# Patient Record
Sex: Male | Born: 1960 | Race: White | Hispanic: No | Marital: Married | Smoking: Former smoker
Health system: Southern US, Community
[De-identification: ages and names within clinical notes are randomized; demographics above are authoritative.]

## PROBLEM LIST (undated history)

## (undated) DIAGNOSIS — I10 Essential (primary) hypertension: Secondary | ICD-10-CM

## (undated) DIAGNOSIS — E7800 Pure hypercholesterolemia, unspecified: Secondary | ICD-10-CM

## (undated) DIAGNOSIS — M1990 Unspecified osteoarthritis, unspecified site: Secondary | ICD-10-CM

## (undated) DIAGNOSIS — G8929 Other chronic pain: Secondary | ICD-10-CM

## (undated) DIAGNOSIS — E119 Type 2 diabetes mellitus without complications: Secondary | ICD-10-CM

---

## 2016-01-12 ENCOUNTER — Encounter (HOSPITAL_COMMUNITY): Payer: Self-pay

## 2016-01-12 ENCOUNTER — Emergency Department (HOSPITAL_COMMUNITY): Payer: BLUE CROSS/BLUE SHIELD

## 2016-01-12 ENCOUNTER — Emergency Department (HOSPITAL_COMMUNITY)
Admission: EM | Admit: 2016-01-12 | Discharge: 2016-01-12 | Disposition: A | Discharge status: Home / Self Care | Payer: BLUE CROSS/BLUE SHIELD | Attending: Emergency Medicine | Admitting: Emergency Medicine

## 2016-01-12 DIAGNOSIS — I10 Essential (primary) hypertension: Secondary | ICD-10-CM | POA: Diagnosis not present

## 2016-01-12 DIAGNOSIS — N201 Calculus of ureter: Secondary | ICD-10-CM | POA: Diagnosis not present

## 2016-01-12 DIAGNOSIS — E119 Type 2 diabetes mellitus without complications: Secondary | ICD-10-CM | POA: Diagnosis not present

## 2016-01-12 DIAGNOSIS — R109 Unspecified abdominal pain: Secondary | ICD-10-CM | POA: Diagnosis present

## 2016-01-12 DIAGNOSIS — Z7982 Long term (current) use of aspirin: Secondary | ICD-10-CM | POA: Insufficient documentation

## 2016-01-12 DIAGNOSIS — Z7984 Long term (current) use of oral hypoglycemic drugs: Secondary | ICD-10-CM | POA: Insufficient documentation

## 2016-01-12 DIAGNOSIS — Z79899 Other long term (current) drug therapy: Secondary | ICD-10-CM | POA: Insufficient documentation

## 2016-01-12 DIAGNOSIS — Z87891 Personal history of nicotine dependence: Secondary | ICD-10-CM | POA: Diagnosis not present

## 2016-01-12 HISTORY — DX: Other chronic pain: G89.29

## 2016-01-12 HISTORY — DX: Type 2 diabetes mellitus without complications: E11.9

## 2016-01-12 HISTORY — DX: Pure hypercholesterolemia, unspecified: E78.00

## 2016-01-12 HISTORY — DX: Essential (primary) hypertension: I10

## 2016-01-12 HISTORY — DX: Unspecified osteoarthritis, unspecified site: M19.90

## 2016-01-12 LAB — BASIC METABOLIC PANEL
Anion gap: 6 (ref 5–15)
BUN: 22 mg/dL — ABNORMAL HIGH (ref 6–20)
CO2: 27 mmol/L (ref 22–32)
Calcium: 9.4 mg/dL (ref 8.9–10.3)
Chloride: 107 mmol/L (ref 101–111)
Creatinine, Ser: 0.97 mg/dL (ref 0.61–1.24)
GFR calc Af Amer: >60 mL/min (ref >60)
GFR calc non Af Amer: >60 mL/min (ref >60)
Glucose, Bld: 122 mg/dL — ABNORMAL HIGH (ref 65–99)
Potassium: 4.1 mmol/L (ref 3.5–5.1)
Sodium: 140 mmol/L (ref 135–145)

## 2016-01-12 LAB — URINALYSIS, ROUTINE W REFLEX MICROSCOPIC
BILIRUBIN URINE: NEGATIVE
Ketones, ur: NEGATIVE mg/dL
Leukocytes, UA: NEGATIVE
Nitrite: NEGATIVE
PH: 5 (ref 5.0–8.0)
Protein, ur: NEGATIVE mg/dL
SPECIFIC GRAVITY, URINE: 1.035 — AB (ref 1.005–1.030)

## 2016-01-12 LAB — URINE MICROSCOPIC-ADD ON
RBC / HPF: NONE SEEN RBC/hpf (ref 0–5)
WBC, UA: NONE SEEN WBC/hpf (ref 0–5)

## 2016-01-12 LAB — CBC WITH DIFFERENTIAL/PLATELET
Basophils Absolute: 0 10*3/uL (ref 0.0–0.1)
Basophils Relative: 0 %
Eosinophils Absolute: 0 10*3/uL (ref 0.0–0.7)
Eosinophils Relative: 0 %
HCT: 43.7 % (ref 39.0–52.0)
Hemoglobin: 14.9 g/dL (ref 13.0–17.0)
Lymphocytes Relative: 21 %
Lymphs Abs: 2 10*3/uL (ref 0.7–4.0)
MCH: 30.2 pg (ref 26.0–34.0)
MCHC: 34.1 g/dL (ref 30.0–36.0)
MCV: 88.6 fL (ref 78.0–100.0)
Monocytes Absolute: 0.5 10*3/uL (ref 0.1–1.0)
Monocytes Relative: 6 %
Neutro Abs: 6.8 10*3/uL (ref 1.7–7.7)
Neutrophils Relative %: 73 %
Platelets: 238 10*3/uL (ref 150–400)
RBC: 4.93 MIL/uL (ref 4.22–5.81)
RDW: 13 % (ref 11.5–15.5)
WBC: 9.3 10*3/uL (ref 4.0–10.5)

## 2016-01-12 MED ORDER — TAMSULOSIN HCL 0.4 MG PO CAPS: 0.4000 mg | ORAL_CAPSULE | Freq: Every day | ORAL | 0 refills | Status: DC

## 2016-01-12 MED ORDER — OXYCODONE-ACETAMINOPHEN 5-325 MG PO TABS: 2.0000 | ORAL_TABLET | ORAL | 0 refills | Status: DC | PRN

## 2016-01-12 MED ORDER — SODIUM CHLORIDE 0.9 % IV BOLUS (SEPSIS)
1000.0000 mL | Freq: Once | INTRAVENOUS | Status: AC
  Administered 2016-01-12: 1000 mL via INTRAVENOUS

## 2016-01-12 NOTE — ED Provider Notes (Signed)
[facility]-EMERGENCY DEPT Provider Note   CSN: [id number] Arrival date & time: 01/12/16  1148  First Provider Contact:  First MD Initiated Contact with Patient 01/12/16 1639        History   Chief Complaint Chief Complaint  Patient presents with  . Back Pain  . Emesis  . Urinary Retention    HPI Patient presents to the emergency department with right-sided flank pain that started early this morning.  The patient states he had excruciating flank pain that started suddenly.  The patient states he never had pain like this in the past.  He states he did have some decreased urination after that time.  He states he did have nausea but no vomiting.  Patient states that nothing seems make the condition better or worse. The patient denies chest pain, shortness of breath, headache,blurred vision, neck pain, fever, cough, weakness, numbness, dizziness, anorexia, edema,  vomiting, diarrhea, rash, back pain, dysuria, hematemesis, bloody stool, near syncope, or syncope. Past Medical History:  Diagnosis Date  . Arthritis   . Chronic pain   . Diabetes mellitus without complication (HCC)   . High cholesterol   . Hypertension     There are no active problems to display for this patient.   History reviewed. No pertinent surgical history.     Home Medications    Prior to Admission medications   Medication Sig Start Date End Date Taking? Authorizing Provider  aspirin EC 81 MG tablet Take 81 mg by mouth daily.   Yes Historical Provider, MD  atorvastatin (LIPITOR) 40 MG tablet Take 40 mg by mouth at bedtime.   Yes Historical Provider, MD  cholecalciferol (VITAMIN D) 1000 units tablet Take 1,000 Units by mouth daily.   Yes Historical Provider, MD  cyclobenzaprine (FLEXERIL) 10 MG tablet Take 10 mg by mouth 3 (three) times daily as needed for muscle spasms.   Yes Historical Provider, MD  diclofenac (VOLTAREN) 75 MG EC tablet Take 75 mg by mouth at bedtime.   Yes Historical Provider, MD  lisinopril  (PRINIVIL,ZESTRIL) 20 MG tablet Take 20 mg by mouth at bedtime.   Yes Historical Provider, MD  metFORMIN (GLUCOPHAGE-XR) 500 MG 24 hr tablet Take 500 mg by mouth 2 (two) times daily with a meal.   Yes Historical Provider, MD  traMADol (ULTRAM) 50 MG tablet Take 50 mg by mouth every 6 (six) hours as needed for moderate pain.   Yes Historical Provider, MD  vitamin B-12 (CYANOCOBALAMIN) 1000 MCG tablet Take 1,000 mcg by mouth daily.   Yes Historical Provider, MD    Family History History reviewed. No pertinent family history.  Social History Social History  Substance Use Topics  . Smoking status: Former Games developer  . Smokeless tobacco: Never Used  . Alcohol use No     Allergies   Review of patient's allergies indicates no known allergies.   Review of Systems Review of Systems All other systems negative except as documented in the HPI. All pertinent positives and negatives as reviewed in the HPI.  Physical Exam Updated Vital Signs BP 148/76 (BP Location: Right Arm)   Pulse (!) 53   Temp 98.3 F (36.8 C) (Oral)   Resp 17   SpO2 100%   Physical Exam  Constitutional: He is oriented to person, place, and time. He appears well-developed and well-nourished. No distress.  HENT:  Head: Normocephalic and atraumatic.  Mouth/Throat: Oropharynx is clear and moist.  Eyes: Pupils are equal, round, and reactive to light.  Neck: Normal range  of motion. Neck supple.  Cardiovascular: Normal rate, regular rhythm and normal heart sounds.  Exam reveals no gallop and no friction rub.   No murmur heard. Pulmonary/Chest: Effort normal and breath sounds normal. No respiratory distress. He has no wheezes.  Abdominal: Soft. Bowel sounds are normal. He exhibits no distension. There is no tenderness. There is no guarding.  Neurological: He is alert and oriented to person, place, and time. He exhibits normal muscle tone. Coordination normal.  Skin: Skin is warm and dry. No rash noted. No erythema.    Psychiatric: He has a normal mood and affect. His behavior is normal.  Nursing note and vitals reviewed.    ED Treatments / Results  Labs (all labs ordered are listed, but only abnormal results are displayed) Labs Reviewed  URINALYSIS, ROUTINE W REFLEX MICROSCOPIC (NOT AT [facility]) - Abnormal; Notable for the following:       Result Value   Color, Urine AMBER (*)    APPearance TURBID (*)    Specific Gravity, Urine 1.035 (*)    Glucose, UA >1000 (*)    Hgb urine dipstick MODERATE (*)    All other components within normal limits  URINE MICROSCOPIC-ADD ON - Abnormal; Notable for the following:    Squamous Epithelial / LPF 0-5 (*)    Bacteria, UA MANY (*)    All other components within normal limits  BASIC METABOLIC PANEL - Abnormal; Notable for the following:    Glucose, Bld 122 (*)    BUN 22 (*)    All other components within normal limits  URINE CULTURE  CBC WITH DIFFERENTIAL/PLATELET    EKG  EKG Interpretation None       Radiology Ct Renal Stone Study  Result Date: 01/12/2016 CLINICAL DATA:  Acute right-sided flank pain which began yesterday. Nausea and vomiting. EXAM: CT ABDOMEN AND PELVIS WITHOUT CONTRAST TECHNIQUE: Multidetector CT imaging of the abdomen and pelvis was performed following the standard protocol without IV contrast. COMPARISON:  None. FINDINGS: The lack of intravenous contrast limits the ability to evaluate solid abdominal organs. Lower chest: Limited visualization of lower thorax demonstrates minimal dependent subpleural ground-glass atelectasis, right greater than left. No discrete focal airspace opacities. No pleural effusion or pneumothorax. Normal heart size.  No pericardial effusion. Hepatobiliary: Normal hepatic contour. There is mild diffuse decreased attenuation of the hepatic parenchyma suggestive of hepatic steatosis. Normal noncontrast appearance of the gallbladder given degree distention. No radiopaque gallstones. No ascites. Pancreas: Normal  noncontrast appearance of the pancreas Spleen: Normal noncontrast appearance of the spleen Adrenals/Urinary Tract: There is a punctate (approximately 3 mm) stone within the superior aspect of the right ureter, at the level of the right UPJ, which results in mild upstream right-sided pelvicaliectasis and asymmetric right-sided perinephric stranding (coronal image 110, series 7). No additional renal stones are seen. No renal stones are seen along the expected course of the left ureter or within the urinary bladder. Normal noncontrast appearance the urinary bladder given degree distention. Note is made of an approximately 1.1 x 1.6 cm hypo attenuating left-sided renal lesion which is incompletely characterized on this noncontrast examination though favored to represent a renal cyst. Normal noncontrast appearance the bilateral adrenal glands Stomach/Bowel: Minimal colonic diverticulosis without evidence of diverticulitis. The bowel is otherwise normal in course and caliber without wall thickening or evidence of enteric obstruction. Normal noncontrast appearance of the terminal ileum and retrocecal appendix. No pneumoperitoneum, pneumatosis or portal venous gas. Vascular/Lymphatic: Scattered very minimal amount of calcified atherosclerotic plaque within a  normal caliber abdominal aorta. There is a minimal amount of ill-defined stranding within the abdominal mesenteric a with associated scattered mesenteric lymph nodes which are numerous though individually not enlarged by size criteria with index mesenteric lymph node measuring 0.5 cm in greatest short axis diameter (image 46, series 2), presumably reactive etiology. No bulky retroperitoneal, mesenteric, pelvic or inguinal lymphadenopathy on this noncontrast examination. Reproductive: Normal noncontrast appearance of the pelvic organs. Several phleboliths are noted within the left hemipelvis. No free fluid in the pelvic cul-de-sac. Other: Small bilateral mesenteric fat  containing inguinal hernias. Musculoskeletal: No acute or aggressive osseous abnormalities IMPRESSION: 1. Punctate (approximately 3 mm) stone at the level of the right UPJ results in mild upstream right-sided pelvicaliectasis and asymmetric right-sided perinephric stranding. 2. No other evidence of nephrolithiasis. No evidence of left-sided urinary obstruction. 3. Suspected hepatic steatosis. Correlation with LFTs is recommended. 4. Suspected left-sided renal cyst, incompletely characterized on this noncontrast examination. 5. Colonic diverticulosis without evidence of diverticulitis. 6.  Aortic Atherosclerosis (ICD10-170.0) Electronically Signed   By: Simonne Come M.D.   On: 01/12/2016 18:47   Procedures Procedures (including critical care time)  Medications Ordered in ED Medications  sodium chloride 0.9 % bolus 1,000 mL (0 mLs Intravenous Stopped 01/12/16 1830)     Initial Impression / Assessment and Plan / ED Course  I have reviewed the triage vital signs and the nursing notes.  Pertinent labs & imaging results that were available during my care of the patient were reviewed by me and considered in my medical decision making (see chart for details).  Clinical Course   Patient be treated for a renal stone.  Advised follow-up with urology.  The patient is feeling completely resolution of his symptoms.  Patient advised plan and all questions were answered   Final Clinical Impressions(s) / ED Diagnoses   Final diagnoses:  Flank pain    New Prescriptions New Prescriptions   No medications on file     Charlestine Night, PA-C 01/16/16 1334    Pricilla Loveless, MD 01/16/16 1541

## 2016-01-12 NOTE — ED Notes (Signed)
Pt reports understanding of discharge information. No questions at time of discharge 

## 2016-01-12 NOTE — ED Triage Notes (Signed)
Pt c/o R lower back pain, emesis, and difficulty urinating starting this morning.  Currently, denies pain.  Pt was seen at Urgent Care earlier and given Toradol IM.  UA resulted a large amount of blood.  Hx of chronic low back pain.

## 2016-01-12 NOTE — Discharge Instructions (Signed)
Return here as needed.  Follow-up with the urologist provided. 

## 2016-01-12 NOTE — ED Notes (Addendum)
Patient comes from home with c/o right flank pain which began acutely yesterday. Patient had some nausea with minimal vomiting earlier today.  He went to PCP office and had a urinalysis which indicated hematuria. Patient's abdomen soft, non-tender to palpation.  Patient has no CVA tenderness.  Patient received Toradol IM at PCP office and states he is currently pain free with the exception of a "twinge" of pain in right flank.

## 2016-01-14 LAB — URINE CULTURE: Culture: NO GROWTH

## 2018-01-03 IMAGING — CT CT RENAL STONE PROTOCOL
2 of 3 series · 15 of 46 positions shown, 17 images · non-contrast
Comparison: None.

CLINICAL DATA: Acute right-sided flank pain which began yesterday.
Nausea and vomiting.

EXAM:
CT ABDOMEN AND PELVIS WITHOUT CONTRAST
TECHNIQUE: Multidetector CT imaging of the abdomen and pelvis was performed
following the standard protocol without IV contrast.

[Series 5: lung · axial · 0.86mm/px · z∈[-167,-33]mm · 12 of 77 slices shown, 14 images]
[im 5/77  soft-tissue]
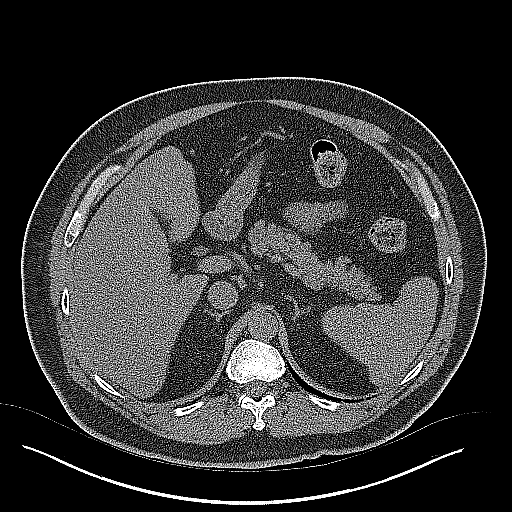
[im 5/77  bone]
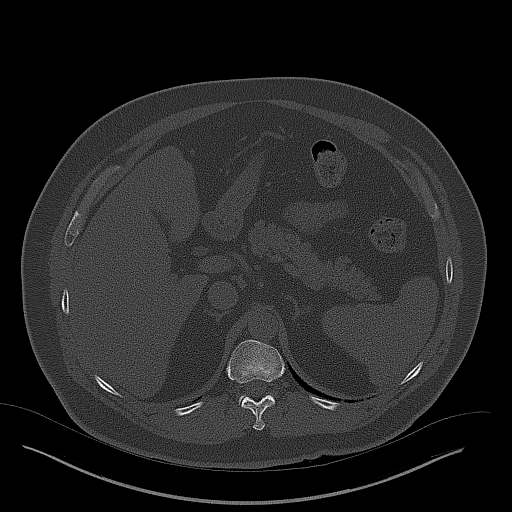
[im 10/77  soft-tissue]
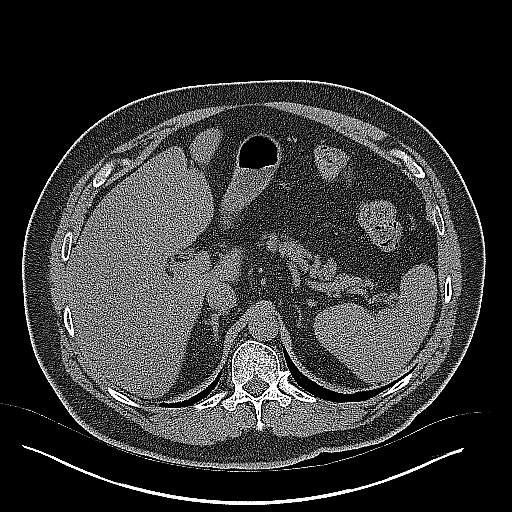
[im 18/77  soft-tissue]
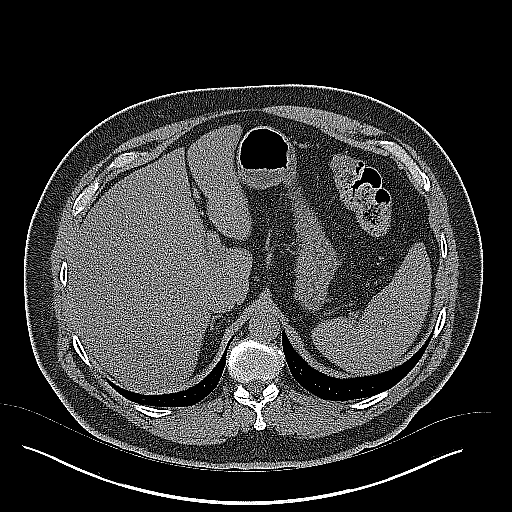
[im 23/77  soft-tissue]
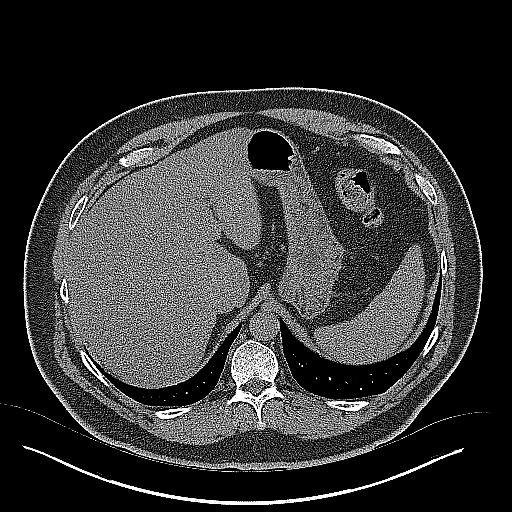
[im 30/77  soft-tissue]
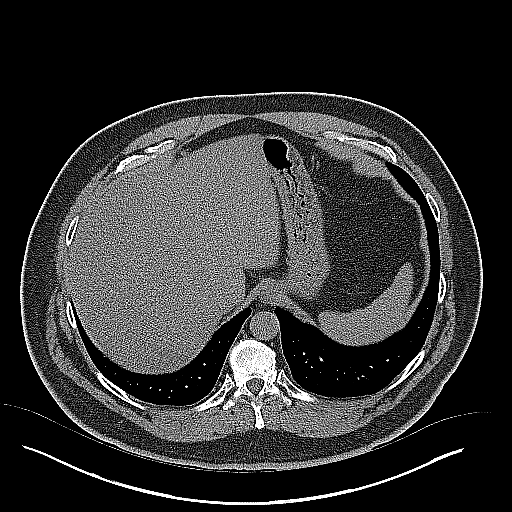
[im 35/77  soft-tissue]
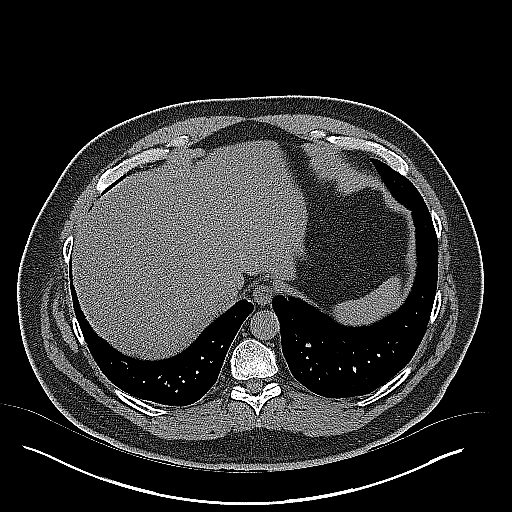
[im 42/77  soft-tissue]
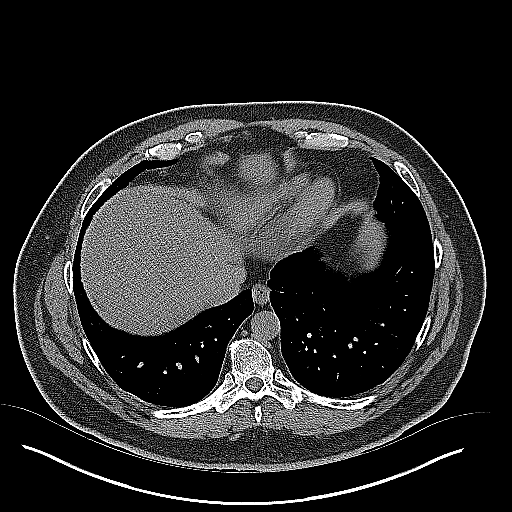
[im 47/77  soft-tissue]
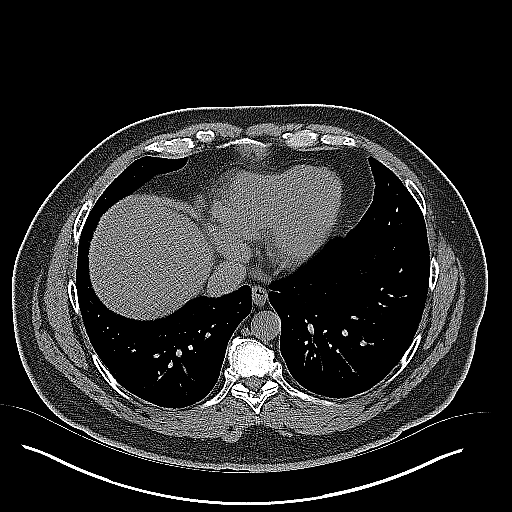
[im 54/77  soft-tissue]
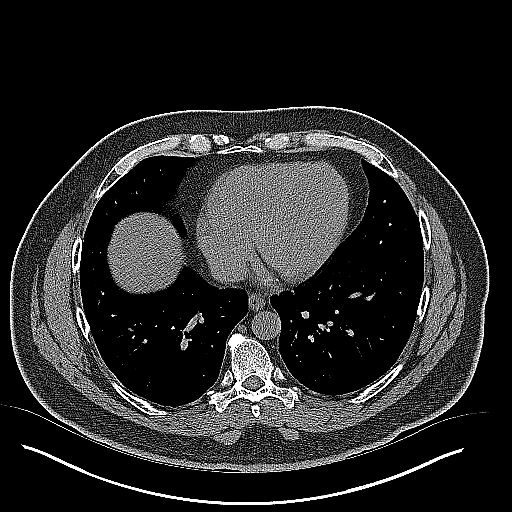
[im 54/77  bone]
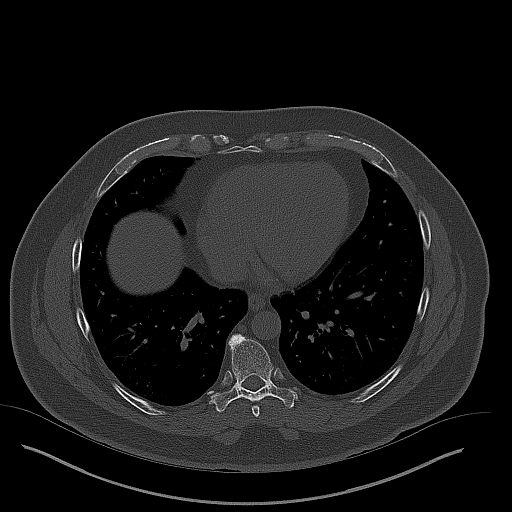
[im 59/77  soft-tissue]
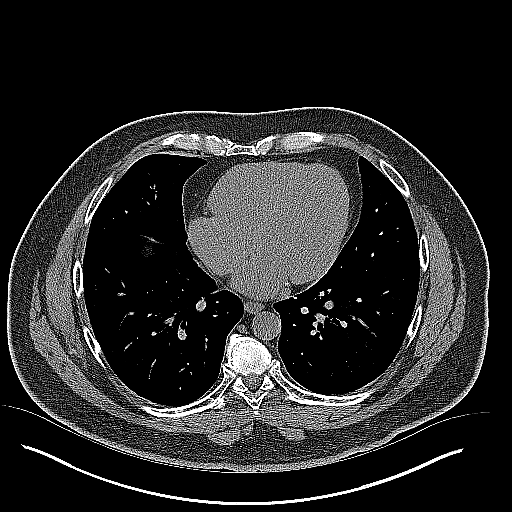
[im 67/77  soft-tissue]
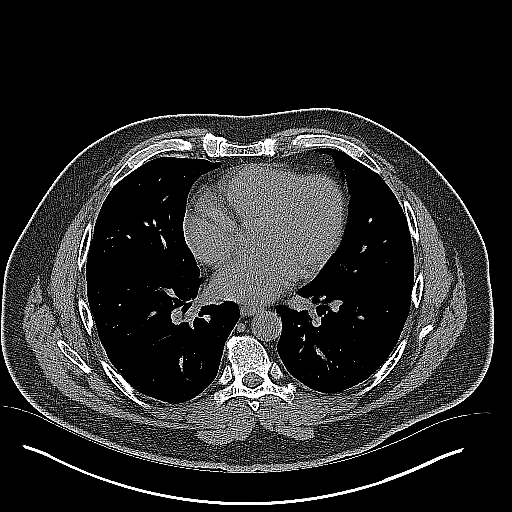
[im 72/77  soft-tissue]
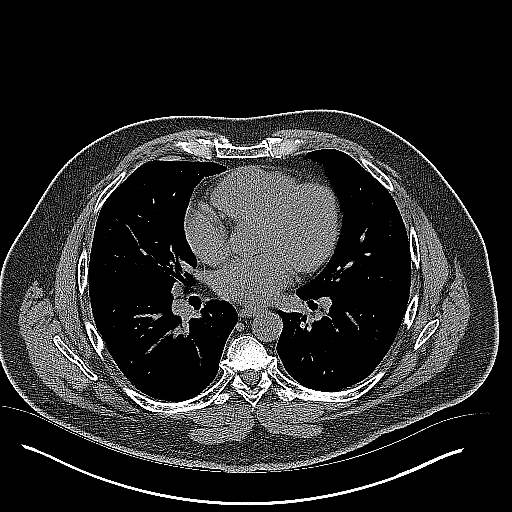

[Series 7: coronal · coronal · 0.90mm/px · 3 of 191 slices shown]
[im 64/191  soft-tissue]
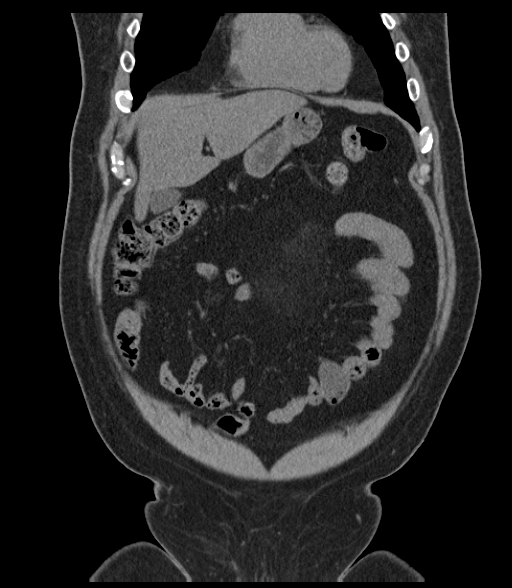
[im 85/191  soft-tissue]
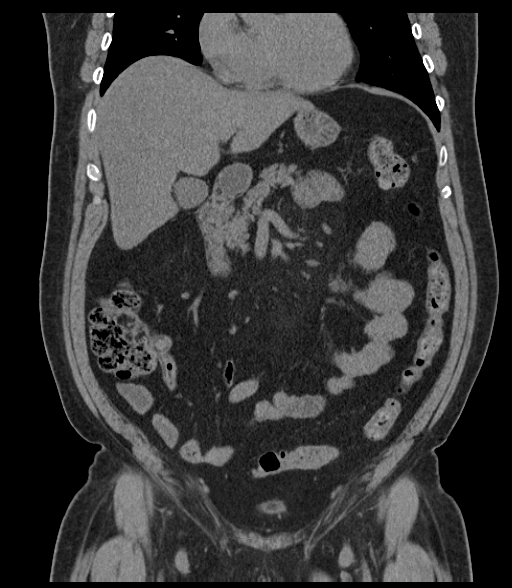
[im 106/191  soft-tissue]
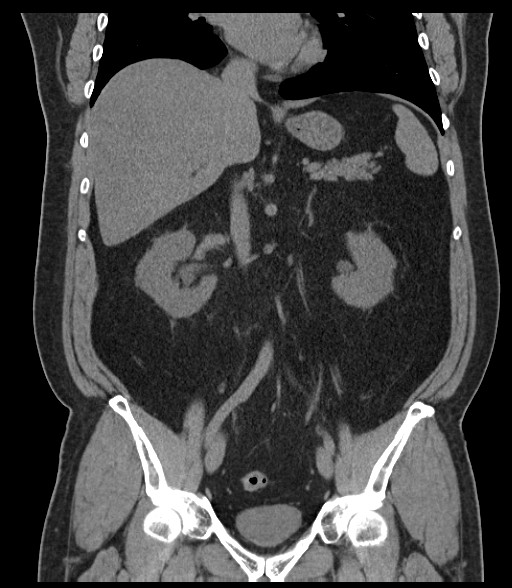

[15 of 46 positions shown; findings below may reference images not displayed]

FINDINGS: The lack of intravenous contrast limits the ability to evaluate
solid abdominal organs.

Lower chest: Limited visualization of lower thorax demonstrates
minimal dependent subpleural ground-glass atelectasis, right greater
than left. No discrete focal airspace opacities. No pleural effusion
or pneumothorax.

Normal heart size.  No pericardial effusion.

Hepatobiliary: Normal hepatic contour. There is mild diffuse
decreased attenuation of the hepatic parenchyma suggestive of
hepatic steatosis. Normal noncontrast appearance of the gallbladder
given degree distention. No radiopaque gallstones. No ascites.

Pancreas: Normal noncontrast appearance of the pancreas

Spleen: Normal noncontrast appearance of the spleen

Adrenals/Urinary Tract: There is a punctate (approximately 3 mm)
stone within the superior aspect of the right ureter, at the level
of the right UPJ, which results in mild upstream right-sided
pelvicaliectasis and asymmetric right-sided perinephric stranding
(coronal image 110, series 7). No additional renal stones are seen.
No renal stones are seen along the expected course of the left
ureter or within the urinary bladder. Normal noncontrast appearance
the urinary bladder given degree distention. Note is made of an
approximately 1.1 x 1.6 cm hypo attenuating left-sided renal lesion
which is incompletely characterized on this noncontrast examination
though favored to represent a renal cyst.

Normal noncontrast appearance the bilateral adrenal glands

Stomach/Bowel: Minimal colonic diverticulosis without evidence of
diverticulitis. The bowel is otherwise normal in course and caliber
without wall thickening or evidence of enteric obstruction. Normal
noncontrast appearance of the terminal ileum and retrocecal
appendix. No pneumoperitoneum, pneumatosis or portal venous gas.

Vascular/Lymphatic: Scattered very minimal amount of calcified
atherosclerotic plaque within a normal caliber abdominal aorta.

There is a minimal amount of ill-defined stranding within the
abdominal mesenteric a with associated scattered mesenteric lymph
nodes which are numerous though individually not enlarged by size
criteria with index mesenteric lymph node measuring 0.5 cm in
greatest short axis diameter (image 46, series 2), presumably
reactive etiology.

No bulky retroperitoneal, mesenteric, pelvic or inguinal
lymphadenopathy on this noncontrast examination.

Reproductive: Normal noncontrast appearance of the pelvic organs.
Several phleboliths are noted within the left hemipelvis. No free
fluid in the pelvic cul-de-sac.

Other: Small bilateral mesenteric fat containing inguinal hernias.

Musculoskeletal: No acute or aggressive osseous abnormalities
IMPRESSION: 1. Punctate (approximately 3 mm) stone at the level of the right UPJ
results in mild upstream right-sided pelvicaliectasis and asymmetric
right-sided perinephric stranding.
2. No other evidence of nephrolithiasis. No evidence of left-sided
urinary obstruction.
3. Suspected hepatic steatosis. Correlation with LFTs is
recommended.
4. Suspected left-sided renal cyst, incompletely characterized on
this noncontrast examination.
5. Colonic diverticulosis without evidence of diverticulitis.
6.  Aortic Atherosclerosis ([id number])

## 2022-01-23 ENCOUNTER — Other Ambulatory Visit: Payer: Self-pay | Admitting: Urology

## 2022-01-23 ENCOUNTER — Encounter (HOSPITAL_BASED_OUTPATIENT_CLINIC_OR_DEPARTMENT_OTHER): Payer: Self-pay | Admitting: Urology

## 2022-01-23 NOTE — Progress Notes (Signed)
Pre-procedure call completed.  Medications discussed.  Patient's wife will be his transportation and 24 hour caregiver.  Patient has stopped Voltaren and ASA.

## 2022-01-26 ENCOUNTER — Ambulatory Visit (HOSPITAL_BASED_OUTPATIENT_CLINIC_OR_DEPARTMENT_OTHER)
Admission: RE | Admit: 2022-01-26 | Discharge: 2022-01-26 | Disposition: A | Discharge status: Home / Self Care | Payer: BC Managed Care – PPO | Attending: Urology | Admitting: Urology

## 2022-01-26 ENCOUNTER — Encounter (HOSPITAL_BASED_OUTPATIENT_CLINIC_OR_DEPARTMENT_OTHER): Payer: Self-pay | Admitting: Urology

## 2022-01-26 ENCOUNTER — Other Ambulatory Visit: Payer: Self-pay

## 2022-01-26 ENCOUNTER — Encounter (HOSPITAL_BASED_OUTPATIENT_CLINIC_OR_DEPARTMENT_OTHER)
Admission: RE | Disposition: A | Discharge status: Home / Self Care | Payer: Self-pay | Source: Home / Self Care | Attending: Urology

## 2022-01-26 ENCOUNTER — Ambulatory Visit (HOSPITAL_COMMUNITY): Payer: BC Managed Care – PPO

## 2022-01-26 DIAGNOSIS — E669 Obesity, unspecified: Secondary | ICD-10-CM | POA: Diagnosis not present

## 2022-01-26 DIAGNOSIS — N200 Calculus of kidney: Secondary | ICD-10-CM | POA: Diagnosis present

## 2022-01-26 DIAGNOSIS — Z6832 Body mass index (BMI) 32.0-32.9, adult: Secondary | ICD-10-CM | POA: Insufficient documentation

## 2022-01-26 DIAGNOSIS — Z87891 Personal history of nicotine dependence: Secondary | ICD-10-CM | POA: Insufficient documentation

## 2022-01-26 HISTORY — PX: EXTRACORPOREAL SHOCK WAVE LITHOTRIPSY: SHX1557

## 2022-01-26 LAB — GLUCOSE, CAPILLARY: Glucose-Capillary: 136 mg/dL — ABNORMAL HIGH (ref 70–99)

## 2022-01-26 SURGERY — LITHOTRIPSY, ESWL
Anesthesia: LOCAL | Laterality: Right

## 2022-01-26 MED ORDER — CIPROFLOXACIN HCL 500 MG PO TABS
ORAL_TABLET | ORAL | Status: AC
  Filled 2022-01-26: qty 1

## 2022-01-26 MED ORDER — OXYCODONE-ACETAMINOPHEN 5-325 MG PO TABS: 1.0000 | ORAL_TABLET | ORAL | 0 refills | Status: DC | PRN

## 2022-01-26 MED ORDER — CIPROFLOXACIN HCL 500 MG PO TABS
500.0000 mg | ORAL_TABLET | ORAL | Status: AC
  Administered 2022-01-26: 500 mg via ORAL

## 2022-01-26 MED ORDER — DOCUSATE SODIUM 100 MG PO CAPS
100.0000 mg | ORAL_CAPSULE | Freq: Every day | ORAL | 0 refills | Status: AC | PRN
Start: 2022-01-26 — End: ?

## 2022-01-26 MED ORDER — DIAZEPAM 5 MG PO TABS
10.0000 mg | ORAL_TABLET | ORAL | Status: AC
  Administered 2022-01-26: 10 mg via ORAL

## 2022-01-26 MED ORDER — DIAZEPAM 5 MG PO TABS
ORAL_TABLET | ORAL | Status: AC
  Filled 2022-01-26: qty 2

## 2022-01-26 MED ORDER — DIPHENHYDRAMINE HCL 25 MG PO CAPS
ORAL_CAPSULE | ORAL | Status: AC
  Filled 2022-01-26: qty 1

## 2022-01-26 MED ORDER — SODIUM CHLORIDE 0.9 % IV SOLN: INTRAVENOUS | Status: DC

## 2022-01-26 MED ORDER — SODIUM CHLORIDE 0.9 % IV SOLN
INTRAVENOUS | Status: DC
  Administered 2022-01-26: 1000 mL via INTRAVENOUS

## 2022-01-26 MED ORDER — DIPHENHYDRAMINE HCL 25 MG PO CAPS
25.0000 mg | ORAL_CAPSULE | ORAL | Status: AC
  Administered 2022-01-26: 25 mg via ORAL

## 2022-01-26 NOTE — Discharge Instructions (Signed)
   Activity:  You are encouraged to ambulate frequently (about every hour during waking hours) to help prevent blood clots from forming in your legs or lungs.  However, you should not engage in any heavy lifting (> 10-15 lbs), strenuous activity, or straining.   Diet: You should advance your diet as instructed by your physician.  It will be normal to have some bloating, nausea, and abdominal discomfort intermittently.   Prescriptions:  You will be provided a prescription for pain medication to take as needed.  If your pain is not severe enough to require the prescription pain medication, you may take extra strength Tylenol instead which will have less side effects.  You should also take a prescribed stool softener to avoid straining with bowel movements as the prescription pain medication may constipate you.   What to call us about: You should call the office ([phone]) if you develop fever > 101 or develop persistent vomiting. Activity:  You are encouraged to ambulate frequently (about every hour during waking hours) to help prevent blood clots from forming in your legs or lungs.  However, you should not engage in any heavy lifting (> 10-15 lbs), strenuous activity, or straining.  

## 2022-01-26 NOTE — Op Note (Signed)
[facility] Operative Note  Treating Physician: Ayelet Gruenewald, MD  Pre-op diagnosis: Right renal stone  Post-op diagnosis: Same   Procedure: Right ESWL  See [facility] OP note scanned into chart. Also because of the size, density, location and other factors that cannot be anticipated I feel this will likely be a staged procedure. This fact supersedes any indication in the scanned [facility] operative note to the contrary.  Matt R. Zyrell Carmean MD [facility]  Pager: [phone]   

## 2022-01-26 NOTE — H&P (Signed)
Office Visit Report     01/22/2022   --------------------------------------------------------------------------------   Estanislado Emms. Seyler  MRN: [id number]  DOB: 1961-06-01, 61 year old Male   PRIMARY CARE:  Roxanne Mins, [place]  REFERRING:    PROVIDER:  Heloise Purpura, M.D.  TREATING:  Karoline Caldwell, M.D.  LOCATION:  [facility] [phone]     --------------------------------------------------------------------------------   CC/HPI: CT from 2021 demonstrated a non-obstructing right renal stone  -Left flank pain started last night and radiates to the LLQ and is associated with urinary hesitancy. Today, he states his pain is episodic, but manageable with Tylenol and ibuprofen  -Mild nausea this AM, but denies emesis  -Denies fever/chills, dysuria or gross hematuria   04/29/2021: CT imaging obtained at last office visit showed a small 1 to 2 mm nonobstructing distal left ureteral calculus. Also with a noted increasing stone burden on the right side as well. Medical expulsive therapy initiated in the management of the distal left ureteral calculus. Patient was prescribed alfuzosin, Zofran as well as Toradol. He returns today for follow-up exam.   A few days after last office visit, the patient spontaneously passed a stone. He shows me a picture on his phone which correlates with a small calculus previously identified on CT imaging. Since interval stone material passage, he has had no recurrence of left-sided pain or discomfort. Back to baseline lower urinary tract symptoms as well. He has no longer taking alpha-blocker therapy nor required pain medication. Outside of stone material passage, he has not had dysuria or gross hematuria. Denies interval recurrence of fever/chills, nausea/vomiting.    The problem is on the left side. His pain started about 04/13/2021. The pain is sharp. The pain is intermittent. The pain does radiate.   Resting< makes the pain better. Nothing causes the  pain to become worse. He was treated with the following pain medication(s): Ibuprofen.  -01/22/22 patient with history nephrolithiasis as above. Followed by Dr. Laverle Patter. Worked in urgently today for  Micro urinalysis is clear on urine spun sediment  CT urogram is obtained today and by initial review shows a nonobstructing 10 x 8 mm right renal pelvic stone. There is a 3 mm right proximal ureter/UPJ stone with mild hydronephrosis. There is no stone distally seen on the right. No stones on the left. Overread is pending. The stone is well imaged on the CT scout film     ALLERGIES: Tamsulosin - bladder pain and unable to urinate    MEDICATIONS: Ketorolac Tromethamine 10 mg tablet 1 tablet PO Q 6 H PRN  Lisinopril 20 mg tablet  Metformin Hcl 500 mg tablet  Adult Low Dose Aspirin Ec 81 mg tablet, delayed release  Diclofenac Potassium  Livalo  Ozempic  Repatha Syringe     GU PSH: No GU PSH    NON-GU PSH: No Non-GU PSH    GU PMH: Renal calculus - 04/29/2021, - 2021 Ureteral calculus (Stable) - 04/29/2021, (Improving), Right, - 2018, - 2017, - 2017, - 2017 Flank Pain - 04/14/2021, Right, Reassured no signs of ureteral stone or hydronephrosis. Most likely flank/back pain is MSK in nature and to take his at home Cyclobenzaprine and Tramadol along with NSAID. If pain persist f/u w/PCP, - 2018 Renal and ureteral calculus, CT stone study from today demonstrates increased nonobstructing stone burden within the right renal pelvis with the largest stone measuring approximately 1 cm in greatest dimension. He also has appears to be a 1 mm left distal ureteral calculus with no appreciable  hydronephrosis - 04/14/2021 Acute Cystitis/UTI - 2021 Ureteral obstruction secondary to calculous (Improving), Right - 2018 Low back pain - 2018 Microscopic hematuria - 2018    NON-GU PMH: Arthritis Diabetes Type 2 Hypercholesterolemia Hypertension    FAMILY HISTORY: Death - Mother, Father   SOCIAL HISTORY: Marital  Status: Married Preferred Language: English; Ethnicity: Not Hispanic Or Latino; Race: White Current Smoking Status: Patient does not smoke anymore.  Has never drank.  Drinks 4+ caffeinated drinks per day. 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    REVIEW OF SYSTEMS:    GU Review Male:   Patient denies frequent urination, hard to postpone urination, burning/ pain with urination, get up at night to urinate, leakage of urine, stream starts and stops, trouble starting your stream, have to strain to urinate , erection problems, and penile pain.  Gastrointestinal (Upper):   Patient denies nausea, vomiting, and indigestion/ heartburn.  Gastrointestinal (Lower):   Patient denies diarrhea and constipation.  Constitutional:   Patient denies fever, night sweats, weight loss, and fatigue.  Skin:   Patient denies skin rash/ lesion and itching.  Eyes:   Patient denies blurred vision and double vision.  Ears/ Nose/ Throat:   Patient denies sore throat and sinus problems.  Hematologic/Lymphatic:   Patient denies swollen glands and easy bruising.  Cardiovascular:   Patient denies leg swelling and chest pains.  Respiratory:   Patient denies cough and shortness of breath.  Endocrine:   Patient denies excessive thirst.  Musculoskeletal:   Patient denies back pain and joint pain.  Neurological:   Patient denies headaches and dizziness.  Psychologic:   Patient denies depression and anxiety.   VITAL SIGNS: None   MULTI-SYSTEM PHYSICAL EXAMINATION:    Gastrointestinal: Patient has right-sided CVA tenderness     PAST DATA REVIEW: None   PROCEDURES:         C.T. Urogram - [id number]      Patient confirmed No Neulasta OnPro Device.         Urinalysis Dipstick Dipstick Cont'd  Color: Amber Bilirubin: Neg mg/dL  Appearance: Clear Ketones: Trace mg/dL  Specific Gravity: >3.810 Blood: Neg ery/uL  pH: 5.5 Protein: Trace mg/dL  Glucose: Neg mg/dL Urobilinogen: 1.0 mg/dL    Nitrites: Neg    Leukocyte  Esterase: Neg leu/uL    ASSESSMENT:      ICD-10 Details  1 GU:   Flank Pain - R10.84 Acute, Complicated Injury  2   Ureteral calculus - N20.1 Acute, Complicated Injury   PLAN:            Medications New Meds: Hydrocodone-Acetaminophen 5 mg-325 mg tablet 1 tablet PO Q 6 H   #20  0 Refill(s)  Pharmacy Name:  [facility] [phone]  Address:  [address]   [place], [place] [address]  Phone:  [phone]  Fax:  [phone]            Orders X-Rays: [facility] Protocol Without I.V. Contrast. No Oral Contrast  X-Ray Notes: . History:  Hematuria: Yes/No  Patient to see MD after exam: Yes/No  Previous exam: CT / IVP/ US/ KUB/ None  When:  Where:  Diabetic: Yes/ No  BUN/ Creatinine:  Date of last BUN Creatinine:  Weight in pounds:  Allergy- IV Contrast: Yes/ No  Conflicting diabetic meds: Yes/ No  Diabetic Meds:  Prior Authorization #: NPCR per Sherrill Raring [id number]           Schedule         Document Letter(s):  Created  for Patient: Clinical Summary         Notes:   Discussed CT findings with the patient. We discussed treatment options for his right proximal stone. Discussed possibility of immediate stent versus trial of passage with alpha-blocker. Also discussed possibility of ureteroscopy versus in situ lithotripsy. After discussion he wants to check on availability of lithotripsy. The stone was well visualized on CT scout film so should be able to image it. I have given him IM injection of Toradol today and we will schedule him accordingly for next available in situ ESL. Risk and benefits of lithotripsy discussed as outlined below. He will call for worsening pain nausea vomiting or fever in the interim. Prescription for hydrocodone was also sent today.   I have discussed with the patient the risks and consequences of the procedure of extracorporeal shockwave lithotripsy to include, but not limited to: Bleeding, including bleeding in the urine, bleeding around  the kidney with hematoma formation and rarely bleeding to the point of loss of the kidney, infection, damage to the surrounding structures including soft tissue and bowel perforation, residual stone fragments requiring the need for future treatments including endoscopic surgery, open surgery or percutaneous surgery. I have emphasized that study showed that up to 25% of patients will require additional procedures depending on stone size and composition. I have also discussed with the patient that the success rate for ESWL is approximately 60-90% and depends on stone location and stone composition as well as preoperative stone size. The patient voices understanding of the risks and benefits of the above and consents to the procedure.    Urology Preoperative H&P   Chief Complaint: Right UPJ stone  History of Present Illness: JERMAIN CURT is a 61 y.o. male with a right UPJ stone here for right ESWL. Denies fevers, chills, dysuria.   Past Medical History:  Diagnosis Date   Arthritis    Chronic pain    Diabetes mellitus without complication (HCC)    High cholesterol    Hypertension     History reviewed. No pertinent surgical history.  Allergies:  Allergies  Allergen Reactions   Tamsulosin Nausea And Vomiting    History reviewed. No pertinent family history.  Social History:  reports that he has quit smoking. He has never used smokeless tobacco. He reports that he does not drink alcohol and does not use drugs.  ROS: A complete review of systems was performed.  All systems are negative except for pertinent findings as noted.  Physical Exam:  Vital signs in last 24 hours: Temp:  [97.8 F (36.6 C)] 97.8 F (36.6 C) (08/07 0734) Pulse Rate:  [72] 72 (08/07 0734) Resp:  [16] 16 (08/07 0734) BP: (142)/(86) 142/86 (08/07 0734) SpO2:  [98 %] 98 % (08/07 0734) Weight:  [106.1 kg] 106.1 kg (08/07 0734) Constitutional:  Alert and oriented, No acute distress Cardiovascular: Regular rate and  rhythm Respiratory: Normal respiratory effort, Lungs clear bilaterally GI: Abdomen is soft, nontender, nondistended, no abdominal masses GU: No CVA tenderness Lymphatic: No lymphadenopathy Neurologic: Grossly intact, no focal deficits Psychiatric: Normal mood and affect  Laboratory Data:  No results for input(s): "WBC", "HGB", "HCT", "PLT" in the last 72 hours.  No results for input(s): "NA", "K", "CL", "GLUCOSE", "BUN", "CALCIUM", "CREATININE" in the last 72 hours.  Invalid input(s): "CO3"   Results for orders placed or performed during the hospital encounter of 01/26/22 (from the past 24 hour(s))  Glucose, capillary     Status: Abnormal   Collection  Time: 01/26/22  7:41 AM  Result Value Ref Range   Glucose-Capillary 136 (H) 70 - 99 mg/dL   No results found for this or any previous visit (from the past 240 hour(s)).  Renal Function: No results for input(s): "CREATININE" in the last 168 hours. CrCl cannot be calculated (Patient's most recent lab result is older than the maximum 21 days allowed.).  Radiologic Imaging: No results found.  I independently reviewed the above imaging studies.  Assessment and Plan KORDELL JAFRI is a 61 y.o. male with a right UPJ stone here for right ESWL.  The risks, benefits and alternatives of right ESWL was discussed with the patient. I described the risks which include arrhythmia, kidney contusion, kidney hemorrhage, need for transfusion, back discomfort, flank ecchymosis, flank abrasion, inability to fracture the stone, inability to pass stone fragments, Steinstrasse, infection associated with obstructing stones, need for an alternative surgical procedure and possible need for repeat shockwave lithotripsy.  The patient voices understanding and wishes to proceed.       Matt R. Kaipo Ardis MD 01/26/2022, 7:52 AM  [facility] Pager: [phone]): [phone]

## 2022-01-26 NOTE — OR Nursing (Signed)
Litho site to right cdi-pink

## 2022-01-27 ENCOUNTER — Encounter (HOSPITAL_BASED_OUTPATIENT_CLINIC_OR_DEPARTMENT_OTHER): Payer: Self-pay | Admitting: Urology

## 2022-02-16 ENCOUNTER — Other Ambulatory Visit: Payer: Self-pay | Admitting: Urology

## 2022-03-09 NOTE — Progress Notes (Signed)
Talked with patient. Instructions given. Arrival time 0600. Will stop ASA and Voltaren on Friday AM. Wife is the driver. Meds and hx reviewed

## 2022-03-16 ENCOUNTER — Ambulatory Visit (HOSPITAL_COMMUNITY): Payer: BC Managed Care – PPO

## 2022-03-16 ENCOUNTER — Other Ambulatory Visit: Payer: Self-pay

## 2022-03-16 ENCOUNTER — Encounter (HOSPITAL_BASED_OUTPATIENT_CLINIC_OR_DEPARTMENT_OTHER): Payer: Self-pay | Admitting: Urology

## 2022-03-16 ENCOUNTER — Encounter (HOSPITAL_BASED_OUTPATIENT_CLINIC_OR_DEPARTMENT_OTHER)
Admission: RE | Disposition: A | Discharge status: Home / Self Care | Payer: Self-pay | Source: Home / Self Care | Attending: Urology

## 2022-03-16 ENCOUNTER — Ambulatory Visit (HOSPITAL_BASED_OUTPATIENT_CLINIC_OR_DEPARTMENT_OTHER)
Admission: RE | Admit: 2022-03-16 | Discharge: 2022-03-16 | Disposition: A | Discharge status: Home / Self Care | Payer: BC Managed Care – PPO | Attending: Urology | Admitting: Urology

## 2022-03-16 DIAGNOSIS — N200 Calculus of kidney: Secondary | ICD-10-CM | POA: Insufficient documentation

## 2022-03-16 HISTORY — PX: EXTRACORPOREAL SHOCK WAVE LITHOTRIPSY: SHX1557

## 2022-03-16 LAB — GLUCOSE, CAPILLARY: Glucose-Capillary: 201 mg/dL — ABNORMAL HIGH (ref 70–99)

## 2022-03-16 SURGERY — LITHOTRIPSY, ESWL
Anesthesia: LOCAL | Laterality: Right

## 2022-03-16 MED ORDER — OXYCODONE-ACETAMINOPHEN 5-325 MG PO TABS: 1.0000 | ORAL_TABLET | ORAL | 0 refills | Status: DC | PRN

## 2022-03-16 MED ORDER — DIAZEPAM 5 MG PO TABS
10.0000 mg | ORAL_TABLET | ORAL | Status: AC
  Administered 2022-03-16: 10 mg via ORAL

## 2022-03-16 MED ORDER — DIPHENHYDRAMINE HCL 25 MG PO CAPS
ORAL_CAPSULE | ORAL | Status: AC
  Filled 2022-03-16: qty 1

## 2022-03-16 MED ORDER — CIPROFLOXACIN HCL 500 MG PO TABS
ORAL_TABLET | ORAL | Status: AC
  Filled 2022-03-16: qty 1

## 2022-03-16 MED ORDER — OXYCODONE-ACETAMINOPHEN 5-325 MG PO TABS: 1.0000 | ORAL_TABLET | ORAL | 0 refills | Status: AC | PRN

## 2022-03-16 MED ORDER — DIPHENHYDRAMINE HCL 25 MG PO CAPS
25.0000 mg | ORAL_CAPSULE | ORAL | Status: AC
  Administered 2022-03-16: 25 mg via ORAL

## 2022-03-16 MED ORDER — DIAZEPAM 5 MG PO TABS
ORAL_TABLET | ORAL | Status: AC
  Filled 2022-03-16: qty 2

## 2022-03-16 MED ORDER — SODIUM CHLORIDE 0.9 % IV SOLN: INTRAVENOUS | Status: DC

## 2022-03-16 MED ORDER — CIPROFLOXACIN HCL 500 MG PO TABS
500.0000 mg | ORAL_TABLET | ORAL | Status: AC
  Administered 2022-03-16: 500 mg via ORAL

## 2022-03-16 NOTE — Discharge Instructions (Signed)

## 2022-03-16 NOTE — Op Note (Signed)
See [facility] OP note scanned into chart. Also because of the size, density, location and other factors that cannot be anticipated I feel this will likely be a staged procedure. This fact supersedes any indication in the scanned [facility] operative note to the contrary.  

## 2022-03-16 NOTE — H&P (Signed)
See scanned H&P

## 2022-03-17 ENCOUNTER — Encounter (HOSPITAL_BASED_OUTPATIENT_CLINIC_OR_DEPARTMENT_OTHER): Payer: Self-pay | Admitting: Urology
